# Patient Record
Sex: Female | Born: 2005 | Race: White | Hispanic: No | Marital: Single
Health system: Southern US, Community
[De-identification: ages and names within clinical notes are randomized; demographics above are authoritative.]

---
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
## 2023-09-16 ENCOUNTER — Other Ambulatory Visit: Payer: Self-pay | Admitting: Physician Assistant
